# Patient Record
Sex: Male | Born: 1950 | Race: Black or African American | Hispanic: No | Marital: Single | Smoking: Never smoker
Health system: Southern US, Community
[De-identification: ages and names within clinical notes are randomized; demographics above are authoritative.]

---

## 2016-11-20 ENCOUNTER — Emergency Department (HOSPITAL_COMMUNITY)
Admission: EM | Admit: 2016-11-20 | Discharge: 2016-11-20 | Disposition: A | Discharge status: Home / Self Care | Payer: BLUE CROSS/BLUE SHIELD | Attending: Emergency Medicine | Admitting: Emergency Medicine

## 2016-11-20 ENCOUNTER — Emergency Department (HOSPITAL_COMMUNITY): Payer: BLUE CROSS/BLUE SHIELD

## 2016-11-20 ENCOUNTER — Encounter (HOSPITAL_COMMUNITY): Payer: Self-pay | Admitting: Emergency Medicine

## 2016-11-20 DIAGNOSIS — Y9389 Activity, other specified: Secondary | ICD-10-CM | POA: Insufficient documentation

## 2016-11-20 DIAGNOSIS — Y929 Unspecified place or not applicable: Secondary | ICD-10-CM | POA: Insufficient documentation

## 2016-11-20 DIAGNOSIS — S39012A Strain of muscle, fascia and tendon of lower back, initial encounter: Secondary | ICD-10-CM | POA: Diagnosis not present

## 2016-11-20 DIAGNOSIS — X501XXA Overexertion from prolonged static or awkward postures, initial encounter: Secondary | ICD-10-CM | POA: Diagnosis not present

## 2016-11-20 DIAGNOSIS — Y999 Unspecified external cause status: Secondary | ICD-10-CM | POA: Diagnosis not present

## 2016-11-20 DIAGNOSIS — S3992XA Unspecified injury of lower back, initial encounter: Secondary | ICD-10-CM | POA: Diagnosis present

## 2016-11-20 MED ORDER — IBUPROFEN 600 MG PO TABS: 600.0000 mg | ORAL_TABLET | Freq: Four times a day (QID) | ORAL | 0 refills | Status: AC | PRN

## 2016-11-20 MED ORDER — IBUPROFEN 800 MG PO TABS
800.0000 mg | ORAL_TABLET | Freq: Once | ORAL | Status: AC
  Administered 2016-11-20: 800 mg via ORAL
  Filled 2016-11-20: qty 1

## 2016-11-20 MED ORDER — METHOCARBAMOL 500 MG PO TABS: 500.0000 mg | ORAL_TABLET | Freq: Three times a day (TID) | ORAL | 0 refills | Status: AC

## 2016-11-20 NOTE — Discharge Instructions (Signed)
Apply ice packs on and off to back. Avoid twisting or bending movements. Follow-up with one of the providers listed or return to the ER for any worsening symptoms.

## 2016-11-20 NOTE — ED Triage Notes (Signed)
Helping move a couch about an hour ago, and other person dropped it- now with upper lumbar pain that is somewhat relieved by leaning to the leaft- Has taken tylenol with slight relief

## 2016-11-20 NOTE — ED Provider Notes (Signed)
[facility]PT Provider Note   CSN: [id number] Arrival date & time: 11/20/16  1835     History   Chief Complaint Chief Complaint  Patient presents with  . Back Pain    back injury 1 hour ago moving furniture    HPI Joseph Lowe is a 66 y.o. male.  HPI   Joseph Lowe is a 65 y.o. male who presents to the Emergency Department complaining of sudden onset of middle and lower back pain. Patient states pain began approximately one hour prior to arrival and occurred while helping someone move a sofa. He states that the other person dropped their end of the sofa which caused a twisting movement to his back. He denies fall. He now describes a sharp pain to his middle and lower back that is worse with movement. He states when she sits down or lies down he has sharp radiating pain into his right buttock and thigh. He took Tylenol prior to arrival without relief. He denies abdominal pain, urine or bowel changes, and numbness or weakness of the lower extremities.     No past medical history on file.  There are no active problems to display for this patient.   No past surgical history on file.     Home Medications    Prior to Admission medications   Not on File    Family History No family history on file.  Social History Social History  Substance Use Topics  . Smoking status: Never Smoker  . Smokeless tobacco: Never Used  . Alcohol use Yes     Comment: social     Allergies   Patient has no known allergies.   Review of Systems Review of Systems  Constitutional: Negative for fever.  Respiratory: Negative for shortness of breath.   Gastrointestinal: Negative for abdominal pain, constipation and vomiting.  Genitourinary: Negative for decreased urine volume, difficulty urinating, dysuria, flank pain and hematuria.  Musculoskeletal: Positive for back pain. Negative for joint swelling.  Skin: Negative for rash.  Neurological: Negative for weakness and numbness.  All  other systems reviewed and are negative.    Physical Exam Updated Vital Signs BP (!) 164/104   Pulse 87   Temp 98.7 F (37.1 C) (Oral)   Resp 18   Ht 5\' 11"  (1.803 m)   Wt 93 kg   SpO2 100%   BMI 28.59 kg/m   Physical Exam  Constitutional: He is oriented to person, place, and time. He appears well-developed and well-nourished. No distress.  HENT:  Head: Normocephalic and atraumatic.  Neck: Normal range of motion. Neck supple.  Cardiovascular: Normal rate, regular rhythm and intact distal pulses.   No murmur heard. Pulmonary/Chest: Effort normal and breath sounds normal. No respiratory distress.  Abdominal: Soft. He exhibits no distension. There is no tenderness.  Musculoskeletal: Normal range of motion. He exhibits tenderness. He exhibits no edema.       Lumbar back: He exhibits tenderness and pain. He exhibits normal range of motion, no swelling, no deformity, no laceration and normal pulse.  ttp of the bilateral thoracic and lumbar paraspinal muscles.  Mild tenderness of lower lumbar spine.  DP pulses are brisk and symmetrical.  Distal sensation intact.  Pt has 5/5 strength against resistance of bilateral lower extremities.     Neurological: He is alert and oriented to person, place, and time. He has normal strength. No sensory deficit. He exhibits normal muscle tone. Coordination and gait normal.  Reflex Scores:      Patellar  reflexes are 2+ on the right side and 2+ on the left side.      Achilles reflexes are 2+ on the right side and 2+ on the left side. Skin: Skin is warm and dry. No rash noted.  Nursing note and vitals reviewed.    ED Treatments / Results  Labs (all labs ordered are listed, but only abnormal results are displayed) Labs Reviewed - No data to display  EKG  EKG Interpretation None       Radiology Dg Thoracic Spine 2 View  Result Date: 11/20/2016 CLINICAL DATA:  Insert epic study EXAM: THORACIC SPINE 2 VIEWS COMPARISON:  None. FINDINGS: There is  no acute fracture or subluxation. Moderate degenerative changes are present. No acute displaced rib fractures. IMPRESSION: No evidence for acute  abnormality. Electronically Signed   By: Norva PavlovElizabeth  Brown M.D.   On: 11/20/2016 20:14   Dg Lumbar Spine Complete  Result Date: 11/20/2016 CLINICAL DATA:  MID TO LOW BACK PAIN, Helping move a couch about an hour ago, and other person dropped it- now with upper lumbar pain that is somewhat relieved by leaning to the left No other history documented EXAM: LUMBAR SPINE - COMPLETE 4+ VIEW COMPARISON:  None. FINDINGS: There are degenerative changes in the lumbar spine, most notably at L4-5 and L5-S1. No acute fracture or subluxation. No suspicious lytic or blastic lesions are identified. Bowel gas pattern is nonobstructed. IMPRESSION: No evidence for acute  abnormality. Electronically Signed   By: Norva PavlovElizabeth  Brown M.D.   On: 11/20/2016 20:12    Procedures Procedures (including critical care time)  Medications Ordered in ED Medications  ibuprofen (ADVIL,MOTRIN) tablet 800 mg (not administered)     Initial Impression / Assessment and Plan / ED Course  I have reviewed the triage vital signs and the nursing notes.  Pertinent labs & imaging results that were available during my care of the patient were reviewed by me and considered in my medical decision making (see chart for details).    On recheck, pain improved.  Pt is now sitting in a chair watching TV   Patient ambulates with a steady gait. No focal neuro deficits on exam. X-rays are reassuring. Likely lumbar sprain. He agrees to treatment plan of anti-inflammatory and muscle relaxer. Recommend PCP follow-up in one week if not improving.  2035 At dispo, nursing staff informed me that pt's remains hypertensive.  He denies sx's other than back pain and states he is upset at the person who dropped the sofa.  He appears stable for d/c, and I have advised him that he will need close f/u regarding his BP.  He  agrees to this plan.  Strict return precautions given and referral info given so that he can establish primary care.     Final Clinical Impressions(s) / ED Diagnoses   Final diagnoses:  Strain of lumbar region, initial encounter    New Prescriptions New Prescriptions   IBUPROFEN (ADVIL,MOTRIN) 600 MG TABLET    Take 1 tablet (600 mg total) by mouth every 6 (six) hours as needed. Take with food   METHOCARBAMOL (ROBAXIN) 500 MG TABLET    Take 1 tablet (500 mg total) by mouth 3 (three) times daily.     Pauline Ausammy Marvel Mcphillips, PA-C 11/20/16 2025    Dasie Chancellor, PA-C 11/20/16 2042    Lavera Guiseana Duo Liu, MD 11/21/16 [phone]

## 2016-11-27 ENCOUNTER — Encounter (HOSPITAL_COMMUNITY): Payer: Self-pay | Admitting: Emergency Medicine

## 2016-11-27 ENCOUNTER — Emergency Department (HOSPITAL_COMMUNITY)
Admission: EM | Admit: 2016-11-27 | Discharge: 2016-11-27 | Disposition: A | Discharge status: Home / Self Care | Payer: BLUE CROSS/BLUE SHIELD | Attending: Emergency Medicine | Admitting: Emergency Medicine

## 2016-11-27 DIAGNOSIS — X500XXD Overexertion from strenuous movement or load, subsequent encounter: Secondary | ICD-10-CM | POA: Diagnosis not present

## 2016-11-27 DIAGNOSIS — S3992XD Unspecified injury of lower back, subsequent encounter: Secondary | ICD-10-CM | POA: Diagnosis present

## 2016-11-27 DIAGNOSIS — Z791 Long term (current) use of non-steroidal anti-inflammatories (NSAID): Secondary | ICD-10-CM | POA: Insufficient documentation

## 2016-11-27 DIAGNOSIS — S39012D Strain of muscle, fascia and tendon of lower back, subsequent encounter: Secondary | ICD-10-CM | POA: Diagnosis not present

## 2016-11-27 NOTE — ED Triage Notes (Signed)
Per patient here for re-evaluation of low back pain. Patient seen here on 11/20/2016 after injuring back while lifting a couch. Patient reports some improvement. Denies any problems with BM or urination. Per patient no prescription.

## 2016-11-27 NOTE — ED Notes (Signed)
Pt states he is only here to have his papers filled out by his employer. States he needs nothing else. PA aware

## 2016-11-27 NOTE — ED Provider Notes (Signed)
[facility] Provider Note   CSN: [id number] Arrival date & time: 11/27/16  1241     History   Chief Complaint Chief Complaint  Patient presents with  . Back Pain    HPI Joseph Lowe is a 66 y.o. male.  66 year old gentleman was seen on the third twisted his back. Some furniture a these here tonight basically just be rechecked and to get his paperwork back to work he's got good range of motion has some  soreness present no neurologic deficits noted. The patient states that his pain has decreased significantly, and he feels that he can return to his work duty. He does not have a primary physician at this time so he return to the emergency department for evaluation and to have his paperwork completed.      History reviewed. No pertinent past medical history.  There are no active problems to display for this patient.   History reviewed. No pertinent surgical history.     Home Medications    Prior to Admission medications   Medication Sig Start Date End Date Taking? Authorizing Provider  ibuprofen (ADVIL,MOTRIN) 200 MG tablet Take 400 mg by mouth every 6 (six) hours as needed.   Yes Historical Provider, MD  ibuprofen (ADVIL,MOTRIN) 600 MG tablet Take 1 tablet (600 mg total) by mouth every 6 (six) hours as needed. Take with food Patient not taking: Reported on 11/27/2016 11/20/16   Joseph Triplett, PA-C  methocarbamol (ROBAXIN) 500 MG tablet Take 1 tablet (500 mg total) by mouth 3 (three) times daily. Patient not taking: Reported on 11/27/2016 11/20/16   Joseph Ausammy Triplett, PA-C    Family History History reviewed. No pertinent family history.  Social History Social History  Substance Use Topics  . Smoking status: Never Smoker  . Smokeless tobacco: Never Used  . Alcohol use Yes     Comment: social     Allergies   Patient has no known allergies.   Review of Systems Review of Systems  Constitutional: Negative for activity change.       All ROS Neg except as noted  in HPI  HENT: Negative for nosebleeds.   Eyes: Negative for photophobia and discharge.  Respiratory: Negative for cough, shortness of breath and wheezing.   Cardiovascular: Negative for chest pain and palpitations.  Gastrointestinal: Negative for abdominal pain and blood in stool.  Genitourinary: Negative for dysuria, frequency and hematuria.  Musculoskeletal: Positive for back pain. Negative for arthralgias and neck pain.  Skin: Negative.   Neurological: Negative for dizziness, seizures and speech difficulty.  Psychiatric/Behavioral: Negative for confusion and hallucinations.     Physical Exam Updated Vital Signs BP 180/83 (BP Location: Left Arm)   Pulse 84   Temp 97.4 F (36.3 C) (Oral)   Resp 18   Ht 5\' 11"  (1.803 m)   Wt 93 kg   SpO2 99%   BMI 28.59 kg/m   Physical Exam  Constitutional: He is oriented to person, place, and time. He appears well-developed and well-nourished.  Non-toxic appearance.  HENT:  Head: Normocephalic.  Right Ear: Tympanic membrane and external ear normal.  Left Ear: Tympanic membrane and external ear normal.  Eyes: EOM and lids are normal. Pupils are equal, round, and reactive to light.  Neck: Normal range of motion. Neck supple. Carotid bruit is not present.  Cardiovascular: Normal rate, regular rhythm, normal heart sounds, intact distal pulses and normal pulses.   Pulmonary/Chest: Breath sounds normal. No respiratory distress.  Abdominal: Soft. Bowel sounds are normal. There  is no tenderness. There is no guarding.  Musculoskeletal: Normal range of motion.  There is no palpable step off of the cervical, thoracic, or lumbar spine. There is soreness of the paraspinal muscle areas with attempted range of motion of the lower lumbar area.  Lymphadenopathy:       Head (right side): No submandibular adenopathy present.       Head (left side): No submandibular adenopathy present.    He has no cervical adenopathy.  Neurological: He is alert and oriented  to person, place, and time. He has normal strength. No cranial nerve deficit or sensory deficit.  There no gross neurologic deficits noted of the lower extremities. There is no foot drop appreciated. There no sensory deficits appreciated.  Skin: Skin is warm and dry.  Psychiatric: He has a normal mood and affect. His speech is normal.  Nursing note and vitals reviewed.    ED Treatments / Results  Labs (all labs ordered are listed, but only abnormal results are displayed) Labs Reviewed - No data to display  EKG  EKG Interpretation None       Radiology No results found.  Procedures Procedures (including critical care time)  Medications Ordered in ED Medications - No data to display   Initial Impression / Assessment and Plan / ED Course  I have reviewed the triage vital signs and the nursing notes.  Pertinent labs & imaging results that were available during my care of the patient were reviewed by me and considered in my medical decision making (see chart for details).     *I have reviewed nursing notes, vital signs, and all appropriate lab and imaging results for this patient.**  Final Clinical Impressions(s) / ED Diagnoses  Blood pressure is elevated at 180/83, otherwise the vital signs are within normal limits. There no gross neurologic deficits appreciated of the lower extremities. Gait is steady and at baseline according to the patient.  The patient presented paperwork to return to work. Patient states that his pain has significantly improved. He is using a TEN's unit instead of the prescribed medication. The patient states he is getting all well with this. He feels that he can return to his work duty at this time. The patient states that he does not have a primary physician to do his paperwork and that might came back to the emergency department. The paperwork was completed to the best of my ability. I explained this to the patient and he is accepting of my filling it  out.    Final diagnoses:  Strain of lumbar region, subsequent encounter    New Prescriptions New Prescriptions   No medications on file     Joseph Quale, PA-C 11/27/16 1702    Joseph Mulders, MD 11/27/16 [phone]

## 2016-11-27 NOTE — Discharge Instructions (Signed)
Use warm tub soaks daily , or use heating pad to your lower back. You may also continue to use your TEN'S unit for discomfort. Your blood pressure is elevated at 180/83. Please establish a primary physician to assist you with that and also to follow the lumbar strain related to your back injury.

## 2018-08-24 IMAGING — DX DG THORACIC SPINE 2V
4 series · 4 of 4 positions shown · non-contrast
Comparison: None.

CLINICAL DATA: Insert [REDACTED] study

EXAM:
THORACIC SPINE 2 VIEWS

[t-spine ap (1 of 2)]
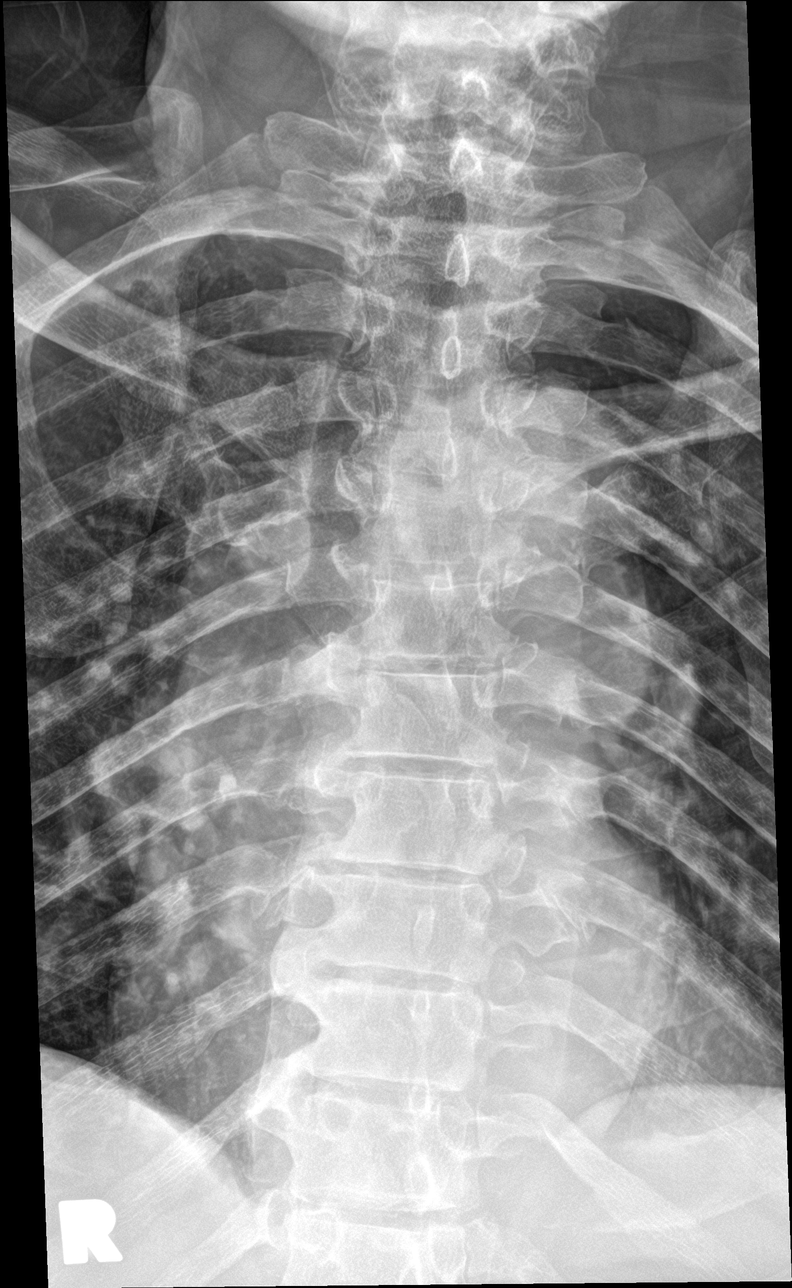

[t-spine lat (1 of 2)]
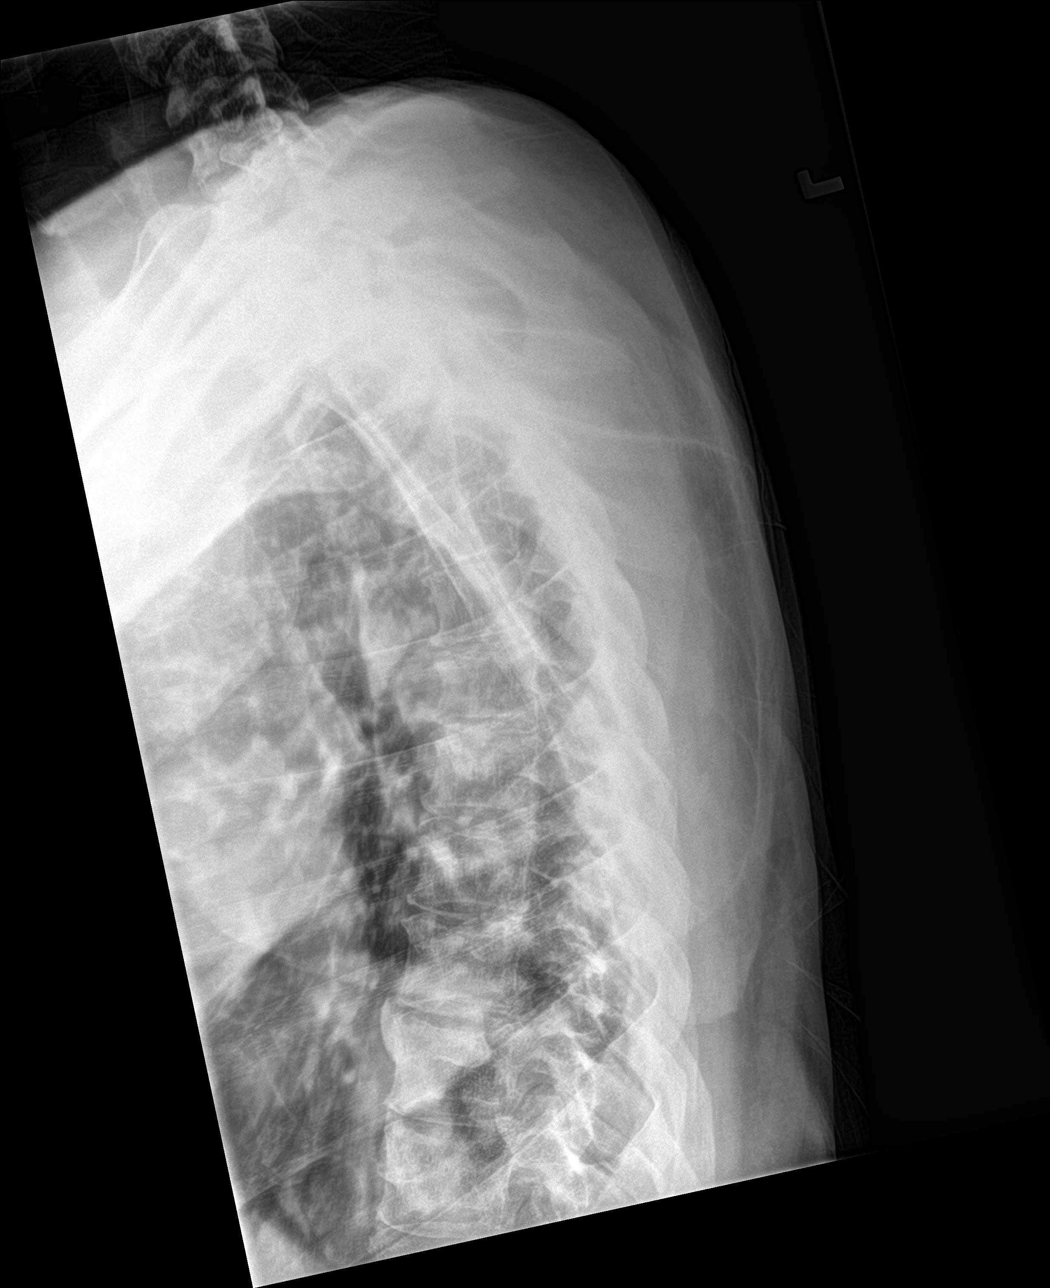

[t-spine ap (2 of 2)]
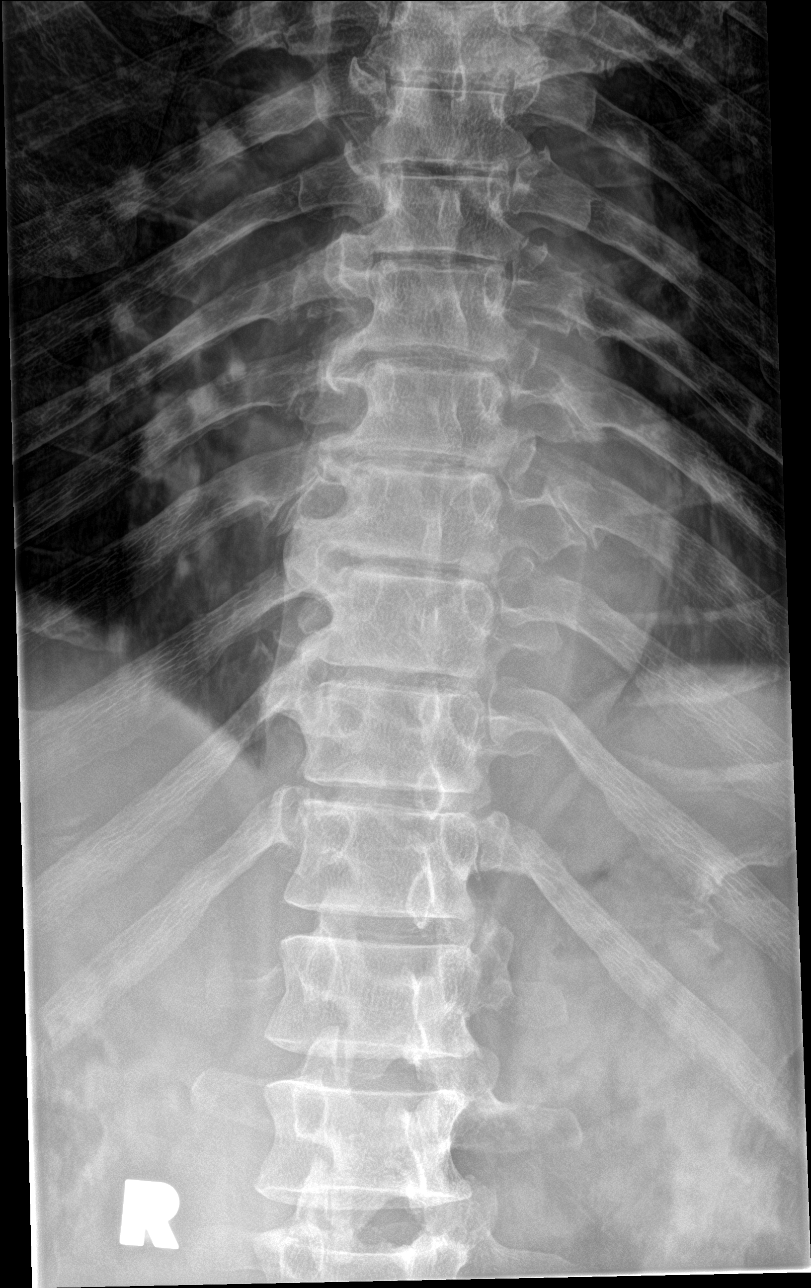

[t-spine lat (2 of 2)]
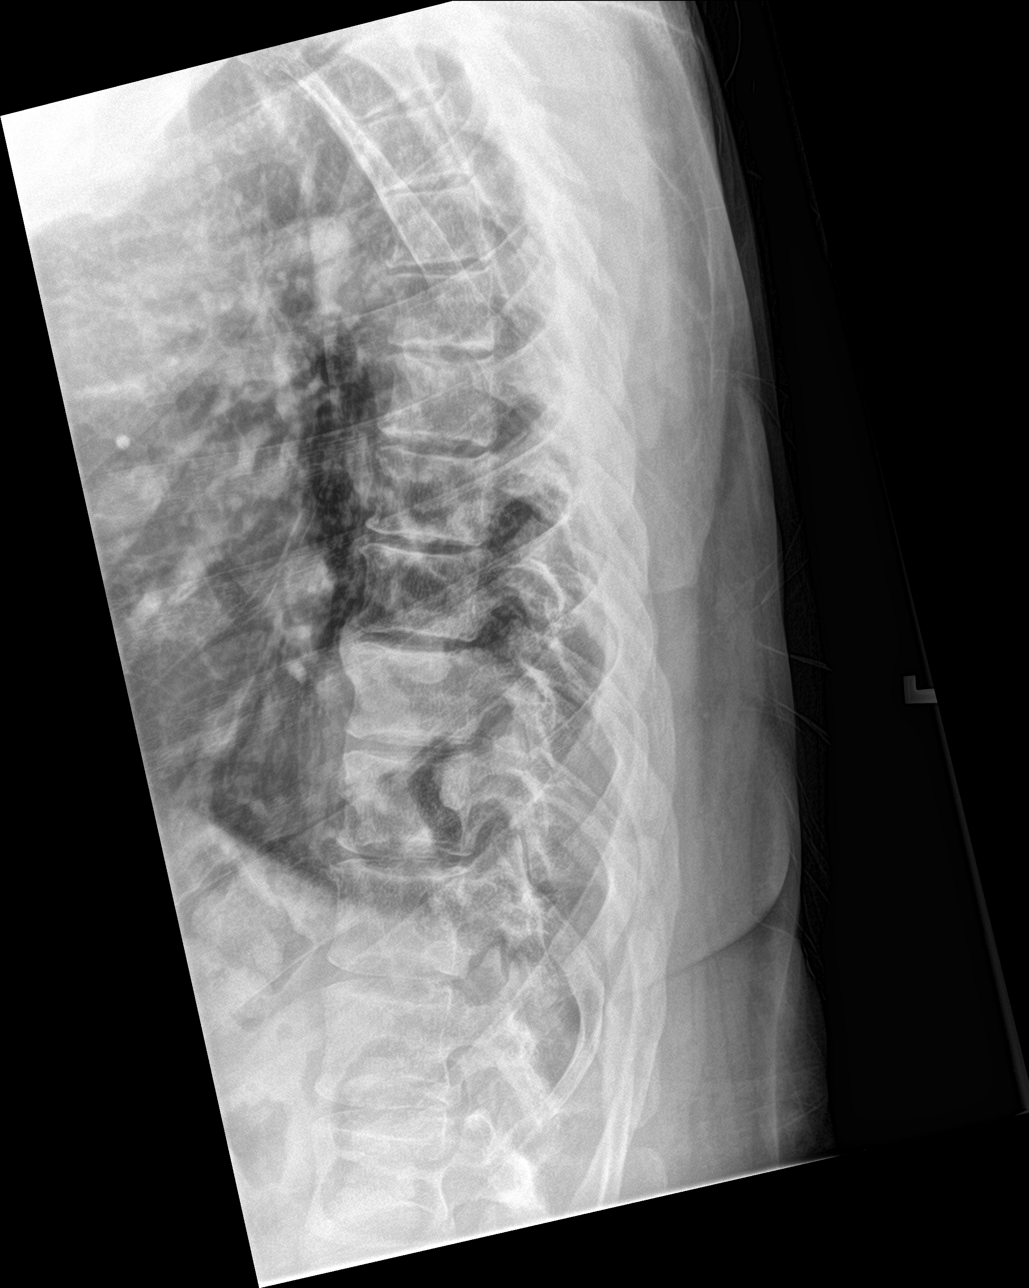

[4 of 4 positions shown; findings below may reference images not displayed]

FINDINGS: There is no acute fracture or subluxation. Moderate degenerative
changes are present. No acute displaced rib fractures.
IMPRESSION: No evidence for acute  abnormality.
